# Patient Record
Sex: Female | Born: 1935 | Race: White | Hispanic: No | Smoking: Never smoker
Health system: Southern US, Community
[De-identification: ages and names within clinical notes are randomized; demographics above are authoritative.]

## PROBLEM LIST (undated history)

## (undated) DIAGNOSIS — I10 Essential (primary) hypertension: Secondary | ICD-10-CM

## (undated) HISTORY — PX: CATARACT EXTRACTION: SUR2

---

## 2004-11-15 ENCOUNTER — Ambulatory Visit: Payer: Self-pay | Admitting: Internal Medicine

## 2005-11-16 ENCOUNTER — Ambulatory Visit: Payer: Self-pay | Admitting: Internal Medicine

## 2006-02-05 ENCOUNTER — Ambulatory Visit: Payer: Self-pay

## 2006-06-06 ENCOUNTER — Ambulatory Visit: Payer: Self-pay | Admitting: Gastroenterology

## 2006-09-12 ENCOUNTER — Ambulatory Visit: Payer: Self-pay | Admitting: Internal Medicine

## 2006-11-06 ENCOUNTER — Ambulatory Visit: Payer: Self-pay | Admitting: Unknown Physician Specialty

## 2006-11-19 ENCOUNTER — Ambulatory Visit: Payer: Self-pay | Admitting: Internal Medicine

## 2007-10-01 ENCOUNTER — Encounter: Payer: Self-pay | Admitting: Otolaryngology

## 2007-12-02 ENCOUNTER — Ambulatory Visit: Payer: Self-pay | Admitting: Internal Medicine

## 2008-04-29 ENCOUNTER — Other Ambulatory Visit: Payer: Self-pay

## 2008-04-29 ENCOUNTER — Ambulatory Visit: Payer: Self-pay | Admitting: General Surgery

## 2008-05-06 ENCOUNTER — Ambulatory Visit: Payer: Self-pay | Admitting: General Surgery

## 2008-12-16 ENCOUNTER — Ambulatory Visit: Payer: Self-pay | Admitting: Internal Medicine

## 2009-04-08 ENCOUNTER — Ambulatory Visit: Payer: Self-pay | Admitting: Internal Medicine

## 2009-07-21 ENCOUNTER — Emergency Department: Payer: Self-pay | Admitting: Unknown Physician Specialty

## 2009-08-02 ENCOUNTER — Emergency Department: Payer: Self-pay | Admitting: Emergency Medicine

## 2010-02-08 ENCOUNTER — Ambulatory Visit: Payer: Self-pay | Admitting: Internal Medicine

## 2011-09-20 ENCOUNTER — Emergency Department: Payer: Self-pay | Admitting: *Deleted

## 2012-06-03 ENCOUNTER — Emergency Department: Payer: Self-pay | Admitting: Emergency Medicine

## 2015-06-08 ENCOUNTER — Emergency Department
Admission: EM | Admit: 2015-06-08 | Discharge: 2015-06-08 | Disposition: A | Discharge status: Home / Self Care | Payer: Medicare Other | Attending: Student | Admitting: Student

## 2015-06-08 ENCOUNTER — Encounter: Payer: Self-pay | Admitting: *Deleted

## 2015-06-08 ENCOUNTER — Emergency Department: Payer: Medicare Other

## 2015-06-08 DIAGNOSIS — S53025A Posterior dislocation of left radial head, initial encounter: Secondary | ICD-10-CM | POA: Insufficient documentation

## 2015-06-08 DIAGNOSIS — W19XXXA Unspecified fall, initial encounter: Secondary | ICD-10-CM

## 2015-06-08 DIAGNOSIS — Y9289 Other specified places as the place of occurrence of the external cause: Secondary | ICD-10-CM | POA: Insufficient documentation

## 2015-06-08 DIAGNOSIS — S53105A Unspecified dislocation of left ulnohumeral joint, initial encounter: Secondary | ICD-10-CM

## 2015-06-08 DIAGNOSIS — I10 Essential (primary) hypertension: Secondary | ICD-10-CM | POA: Insufficient documentation

## 2015-06-08 DIAGNOSIS — IMO0001 Reserved for inherently not codable concepts without codable children: Secondary | ICD-10-CM

## 2015-06-08 DIAGNOSIS — W1839XA Other fall on same level, initial encounter: Secondary | ICD-10-CM | POA: Diagnosis not present

## 2015-06-08 DIAGNOSIS — Y9389 Activity, other specified: Secondary | ICD-10-CM | POA: Diagnosis not present

## 2015-06-08 DIAGNOSIS — Y998 Other external cause status: Secondary | ICD-10-CM | POA: Diagnosis not present

## 2015-06-08 DIAGNOSIS — S59902A Unspecified injury of left elbow, initial encounter: Secondary | ICD-10-CM | POA: Diagnosis present

## 2015-06-08 DIAGNOSIS — E871 Hypo-osmolality and hyponatremia: Secondary | ICD-10-CM | POA: Diagnosis not present

## 2015-06-08 LAB — CBC WITH DIFFERENTIAL/PLATELET
BASOS ABS: 0 10*3/uL (ref 0–0.1)
Basophils Relative: 0 %
Eosinophils Absolute: 0 10*3/uL (ref 0–0.7)
Eosinophils Relative: 0 %
HEMATOCRIT: 39.7 % (ref 35.0–47.0)
HEMOGLOBIN: 13.1 g/dL (ref 12.0–16.0)
LYMPHS PCT: 11 %
Lymphs Abs: 1 10*3/uL (ref 1.0–3.6)
MCH: 29.5 pg (ref 26.0–34.0)
MCHC: 33 g/dL (ref 32.0–36.0)
MCV: 89.4 fL (ref 80.0–100.0)
Monocytes Absolute: 1 10*3/uL — ABNORMAL HIGH (ref 0.2–0.9)
Monocytes Relative: 11 %
NEUTROS ABS: 6.9 10*3/uL — AB (ref 1.4–6.5)
NEUTROS PCT: 78 %
PLATELETS: 230 10*3/uL (ref 150–440)
RBC: 4.44 MIL/uL (ref 3.80–5.20)
RDW: 14.1 % (ref 11.5–14.5)
WBC: 9 10*3/uL (ref 3.6–11.0)

## 2015-06-08 LAB — APTT: APTT: 24 s (ref 24–36)

## 2015-06-08 LAB — BASIC METABOLIC PANEL
ANION GAP: 6 (ref 5–15)
BUN: 13 mg/dL (ref 6–20)
CHLORIDE: 95 mmol/L — AB (ref 101–111)
CO2: 28 mmol/L (ref 22–32)
Calcium: 9.1 mg/dL (ref 8.9–10.3)
Creatinine, Ser: 0.63 mg/dL (ref 0.44–1.00)
GFR calc Af Amer: >60 mL/min (ref >60)
GLUCOSE: 100 mg/dL — AB (ref 65–99)
POTASSIUM: 4.4 mmol/L (ref 3.5–5.1)
Sodium: 129 mmol/L — ABNORMAL LOW (ref 135–145)

## 2015-06-08 LAB — PROTIME-INR
INR: 0.9
Prothrombin Time: 12.4 seconds (ref 11.4–15.0)

## 2015-06-08 MED ORDER — MORPHINE SULFATE (PF) 2 MG/ML IV SOLN
2.0000 mg | Freq: Once | INTRAVENOUS | Status: AC
  Administered 2015-06-08: 2 mg via INTRAVENOUS
  Filled 2015-06-08: qty 1

## 2015-06-08 MED ORDER — ONDANSETRON HCL 4 MG/2ML IJ SOLN
4.0000 mg | Freq: Once | INTRAMUSCULAR | Status: AC
  Administered 2015-06-08: 4 mg via INTRAVENOUS
  Filled 2015-06-08: qty 2

## 2015-06-08 MED ORDER — MIDAZOLAM HCL 5 MG/5ML IJ SOLN
INTRAMUSCULAR | Status: AC
  Administered 2015-06-08: 0.5 mg via INTRAVENOUS
  Filled 2015-06-08: qty 5

## 2015-06-08 MED ORDER — MIDAZOLAM HCL 5 MG/5ML IJ SOLN
0.5000 mg | Freq: Once | INTRAMUSCULAR | Status: AC
  Administered 2015-06-08: 0.5 mg via INTRAVENOUS

## 2015-06-08 MED ORDER — ACETAMINOPHEN 500 MG PO TABS
500.0000 mg | ORAL_TABLET | Freq: Four times a day (QID) | ORAL | Status: AC
Start: 2015-06-08 — End: 2016-06-07

## 2015-06-08 NOTE — ED Notes (Signed)
Pt and daughter given verbal and written discharge instructions. Verbalized understanding.

## 2015-06-08 NOTE — ED Provider Notes (Signed)
[facility] Emergency Department Provider Note  ____________________________________________  Time seen: Approximately 11:03 AM  I have reviewed the triage vital signs and the nursing notes.   HISTORY  Chief Complaint Fall    HPI Krista Weber is a 81 y.o. female with history of GERD and hypertension, not chronically anticoagulated who presents for evaluation of left elbow pain after fall which occurred suddenly just prior to arrival. The patient reports that she woke this morning in her usual state of health, was getting dressed to head out of the house. She was walking out of the house and she stumbled and fell backwards landing onto the left elbow, she did hit her head, she did not lose consciousness. She had no fainting, no presyncopal prodrome, no lightheadedness, dizziness, chest pain or difficulty breathing prior to fall. She complains of no neck pain. Currently the pain in her elbow is moderate and worse with movement. She denies any recent illness including no cough, sneezing, runny nose, congestion, vomiting, diarrhea, fevers or chills. She reports chronic intermittent lightheadedness for which she is followed by her primary care doctor, Dr. Judithann Sheen, and she reports that she has had issues with low sodium in the past.   History reviewed. No pertinent past medical history.  There are no active problems to display for this patient.   History reviewed. No pertinent past surgical history.  No current outpatient prescriptions on file.  Allergies Macrodantin  History reviewed. No pertinent family history.  Social History Social History  Substance Use Topics  . Smoking status: Never Smoker   . Smokeless tobacco: None  . Alcohol Use: None    Review of Systems Constitutional: No fever/chills Eyes: No visual changes. ENT: No sore throat. Cardiovascular: Denies chest pain. Respiratory: Denies shortness of breath. Gastrointestinal: No abdominal  pain.  No nausea, no vomiting.  No diarrhea.  No constipation. Genitourinary: Negative for dysuria. Musculoskeletal: Negative for back pain. Skin: Negative for rash. Neurological: Negative for headaches, focal weakness or numbness.  10-point ROS otherwise negative.  ____________________________________________   PHYSICAL EXAM:  VITAL SIGNS: ED Triage Vitals  Enc Vitals Group     BP 06/08/15 1048 201/84 mmHg     Pulse Rate 06/08/15 1048 67     Resp 06/08/15 1048 20     Temp 06/08/15 1048 98.9 F (37.2 C)     Temp Source 06/08/15 1048 Oral     SpO2 06/08/15 1048 97 %     Weight 06/08/15 1048 120 lb (54.432 kg)     Height 06/08/15 1048 5\' 4"  (1.626 m)     Head Cir --      Peak Flow --      Pain Score 06/08/15 1051 8     Pain Loc --      Pain Edu? --      Excl. in GC? --     Constitutional: Alert and oriented. Well appearing and in no acute distress. Eyes: Conjunctivae are normal. PERRL. EOMI. Head: Atraumatic. Nose: No congestion/rhinnorhea. Mouth/Throat: Mucous membranes are moist.  Oropharynx non-erythematous. Neck: No stridor.  No cervical spine tenderness to palpation. Cardiovascular: Normal rate, regular rhythm. Grossly normal heart sounds.  Good peripheral circulation. Respiratory: Normal respiratory effort.  No retractions. Lungs CTAB. Gastrointestinal: Soft and nontender. No distention. No abdominal bruits. No CVA tenderness. Genitourinary: deferred Musculoskeletal: No lower extremity tenderness nor edema.  No joint effusions. Tenderness to palpation throughout the left elbow with limited ROM. 2+ left radial pulse, radial/median/ulnar nerve intact in the  left upper extremity. Pelvis is stable to rock and compression. Full painless range of motion of bilateral hip joints. No midline tenderness to palpation throat the T or L-spine. Neurologic:  Normal speech and language. No gross focal neurologic deficits are appreciated. Skin:  Skin is warm, dry and intact. No rash  noted. Psychiatric: Mood and affect are normal. Speech and behavior are normal.  ____________________________________________   LABS (all labs ordered are listed, but only abnormal results are displayed)  Labs Reviewed  CBC WITH DIFFERENTIAL/PLATELET - Abnormal; Notable for the following:    Neutro Abs 6.9 (*)    Monocytes Absolute 1.0 (*)    All other components within normal limits  BASIC METABOLIC PANEL - Abnormal; Notable for the following:    Sodium 129 (*)    Chloride 95 (*)    Glucose, Bld 100 (*)    All other components within normal limits  PROTIME-INR  APTT   ____________________________________________  EKG  ED ECG REPORT I, Gayla Doss, the attending physician, personally viewed and interpreted this ECG.   Date: 06/08/2015  EKG Time: 10:49  Rate: 68  Rhythm: normal EKG, normal sinus rhythm  Axis: Normal  Intervals:none  ST&T Change: No acute ST elevation  ____________________________________________  RADIOLOGY  Left elbow xray IMPRESSION: Posterior dislocation of the left elbow with probable avulsion fractures of the olecranon and coronoid processes.  Attempted post-reduction left elbow xray IMPRESSION: Persistent elbow dislocation. Small fragment arising from the olecranon process in the joint space. There is now an avulsion fracture which probably arises from the coracoid process of the proximal ulna, located inferior to the anteriorly displaced distal humerus.  Post-reduction left elbow xray IMPRESSION: Previously noted dislocation has been reduced. Fracture fragments persists.   CT head IMPRESSION: No acute intracranial abnormality. ____________________________________________   PROCEDURES  Procedure(s) performed:  Reduction of dislocation Date/Time: 2:04 PM Performed by: Gayla Doss Authorized by: Toney Rakes A Consent: Verbal consent obtained. Risks and benefits: risks, benefits and alternatives were discussed Consent  given by: patient Required items: required blood products, implants, devices, and special equipment available Time out: Immediately prior to procedure a "time out" was called to verify the correct patient, procedure, equipment, support staff and site/side marked as required.  Patient sedated: 1 mg versed  Vitals: Vital signs were monitored during sedation. Patient tolerance: Patient tolerated the procedure well with no immediate complications. Joint: left elbow Reduction technique: traction   SPLINT APPLICATION Date/Time: 2:04 PM Authorized by: Toney Rakes A Consent: Verbal consent obtained. Risks and benefits: risks, benefits and alternatives were discussed Consent given by: patient Splint applied by: Dr. Inocencio Homes, nurse, tech Location details: left elbow Splint type: Posterior slab  Supplies used: orthoglass Post-procedure: The splinted body part was neurovascularly unchanged following the procedure. Patient tolerance: Patient tolerated the procedure well with no immediate complications.    Critical Care performed: No  ____________________________________________   INITIAL IMPRESSION / ASSESSMENT AND PLAN / ED COURSE  Pertinent labs & imaging results that were available during my care of the patient were reviewed by me and considered in my medical decision making (see chart for details).  Krista Weber is a 81 y.o. female with history of GERD and hypertension, not chronically anticoagulated who presents for evaluation of left elbow pain after fall which occurred suddenly just prior to arrival. On exam, she is very well-appearing and in no acute distress. Really hypertensive on arrival but I suspect this is secondary to pain, we'll reassess. Her exam is notable for  tenderness throughout the left elbow but exam is otherwise atraumatic. I suspect she had a mechanical fall today. She is endorsing some chronic lightheadedness which is unchanged from prior as well as history of   hyponatremia so will check basic labs, CT head and x-ray left elbow. We'll provide pain control.   ----------------------------------------- 1:57 PM on 06/08/2015 ----------------------------------------- Labs reviewed. CBC unremarkable. Normal coags. BMP shows hyponatremia with sodium of 129 however I discussed this with Dr. Marcello Fennel, on call for her primary care doctor, Dr. Judithann Sheen, who reports that her sodium was 130 when it was checked in the office 3 weeks ago. Hyponatremia is chronic and unchanged from baseline and I do not think that this contributed to her fall today. Dr. Marcello Fennel will speak with Dr. Tonie Griffith. Judithann Sheen nurse to arrange expedient follow-up. Left elbow films were initially concerning for elbow fracture/dislocation. I discussed this with Dr. Hyacinth Meeker of orthopedic surgery who recommended attempt at reduction and splinting in greater than 90 angle at the elbow. The patient received a total of 1 mg of Versed and after 2 attempts, the dislocation was successfully reduced. She remains neurovasculary intact in the left upper extremity after splinting. We discussed return precautions, need for close PCP and orthopedic surgery follow-up and she is comfortable with the discharge plan.  ____________________________________________   FINAL CLINICAL IMPRESSION(S) / ED DIAGNOSES  Final diagnoses:  Elbow dislocation, left, initial encounter  Fall, initial encounter  Hyponatremia      Gayla Doss, MD 06/08/15 1404

## 2015-06-08 NOTE — ED Notes (Signed)
Attempted to start IV on pts right wrist area, vein blew and immediate brusing and discolration to the skin occoured, MD notified, ice applied, ACE wrap applied

## 2015-06-08 NOTE — ED Notes (Signed)
Pt arrives via EMS from home, pt had a fall this AM, denies any LOC, states her feet got tangled up under her, pt states pain on her left elebow, states some dizziness and low NA for several weeks

## 2015-08-13 ENCOUNTER — Emergency Department
Admission: EM | Admit: 2015-08-13 | Discharge: 2015-08-13 | Disposition: A | Discharge status: Home / Self Care | Payer: Medicare Other | Attending: Emergency Medicine | Admitting: Emergency Medicine

## 2015-08-13 ENCOUNTER — Encounter: Payer: Self-pay | Admitting: Emergency Medicine

## 2015-08-13 ENCOUNTER — Emergency Department: Payer: Medicare Other

## 2015-08-13 DIAGNOSIS — S6991XA Unspecified injury of right wrist, hand and finger(s), initial encounter: Secondary | ICD-10-CM | POA: Diagnosis present

## 2015-08-13 DIAGNOSIS — S60219A Contusion of unspecified wrist, initial encounter: Secondary | ICD-10-CM

## 2015-08-13 DIAGNOSIS — S0081XA Abrasion of other part of head, initial encounter: Secondary | ICD-10-CM | POA: Insufficient documentation

## 2015-08-13 DIAGNOSIS — Z79899 Other long term (current) drug therapy: Secondary | ICD-10-CM | POA: Insufficient documentation

## 2015-08-13 DIAGNOSIS — W1839XA Other fall on same level, initial encounter: Secondary | ICD-10-CM | POA: Insufficient documentation

## 2015-08-13 DIAGNOSIS — S60211A Contusion of right wrist, initial encounter: Secondary | ICD-10-CM | POA: Insufficient documentation

## 2015-08-13 DIAGNOSIS — S60212A Contusion of left wrist, initial encounter: Secondary | ICD-10-CM | POA: Diagnosis not present

## 2015-08-13 DIAGNOSIS — Y9389 Activity, other specified: Secondary | ICD-10-CM | POA: Diagnosis not present

## 2015-08-13 DIAGNOSIS — I10 Essential (primary) hypertension: Secondary | ICD-10-CM | POA: Insufficient documentation

## 2015-08-13 DIAGNOSIS — Y998 Other external cause status: Secondary | ICD-10-CM | POA: Insufficient documentation

## 2015-08-13 DIAGNOSIS — W19XXXA Unspecified fall, initial encounter: Secondary | ICD-10-CM

## 2015-08-13 DIAGNOSIS — Y9289 Other specified places as the place of occurrence of the external cause: Secondary | ICD-10-CM | POA: Insufficient documentation

## 2015-08-13 HISTORY — DX: Essential (primary) hypertension: I10

## 2015-08-13 NOTE — ED Notes (Signed)
Pt here after losing her footing, fell and landed on the left side of her face, lac to left temple noted, bleeding controlled, denies use of blood thinners. Also c/o right elbow pain. Denies LOC. Denies pain.

## 2015-08-13 NOTE — ED Provider Notes (Signed)
[facility] Emergency Department Provider Note  ____________________________________________  Time seen: Approximately 5:53 PM  I have reviewed the triage vital signs and the nursing notes.   HISTORY  Chief Complaint Fall; Laceration; and Extremity Laceration    HPI Krista Weber is a 81 y.o. female who presents to the emergency department status post a fall. Per the patient's daughter who accompanied patient the patient had a mechanical fall when she was turning around reaching for her walker. Patient fell forward catching most of her fall with her arms. The patient does have a report of a abrasion to left temple region with minimal bleeding. Per the patient she is not on blood thinners. Is denying any pain or symptoms at this time. She denies headache, visual acuity changes, neck pain, chest pain, shortness of breath, nausea or vomiting, numbness or tingling. Per the daughter the patient has been acting normal since time of incident.   Past Medical History  Diagnosis Date  . Hypertension     There are no active problems to display for this patient.   History reviewed. No pertinent past surgical history.  Current Outpatient Rx  Name  Route  Sig  Dispense  Refill  . acetaminophen (TYLENOL) 500 MG tablet   Oral   Take 1 tablet (500 mg total) by mouth every 6 (six) hours.   30 tablet   0     Allergies Macrodantin  No family history on file.  Social History Social History  Substance Use Topics  . Smoking status: Never Smoker   . Smokeless tobacco: None  . Alcohol Use: No    Review of Systems Constitutional: No fever/chills Eyes: No visual changes. ENT: No sore throat. Cardiovascular: Denies chest pain. Respiratory: Denies shortness of breath. Gastrointestinal: No abdominal pain.  No nausea, no vomiting.  No diarrhea.  No constipation. Genitourinary: Negative for dysuria. Musculoskeletal: Negative for back pain. Skin: Negative for  rash. Endorses abrasion/cut to left temple area. Neurological: Negative for headaches, focal weakness or numbness.  10-point ROS otherwise negative.  ____________________________________________   PHYSICAL EXAM:  VITAL SIGNS: ED Triage Vitals  Enc Vitals Group     BP 08/13/15 1617 148/76 mmHg     Pulse Rate 08/13/15 1617 79     Resp 08/13/15 1617 18     Temp 08/13/15 1617 97.7 F (36.5 C)     Temp Source 08/13/15 1617 Oral     SpO2 08/13/15 1617 95 %     Weight 08/13/15 1617 120 lb (54.432 kg)     Height 08/13/15 1617 5\' 5"  (1.651 m)     Head Cir --      Peak Flow --      Pain Score --      Pain Loc --      Pain Edu? --      Excl. in GC? --     Constitutional: Alert and oriented. Well appearing and in no acute distress. Eyes: Conjunctivae are normal. PERRL. EOMI. Head: Atraumatic. Nose: No congestion/rhinnorhea. Mouth/Throat: Mucous membranes are moist.  Oropharynx non-erythematous. Neck: No stridor.  No cervical spine tenderness to palpation. Cardiovascular: Normal rate, regular rhythm. Grossly normal heart sounds.  Good peripheral circulation. Respiratory: Normal respiratory effort.  No retractions. Lungs CTAB. Gastrointestinal: Soft and nontender. No distention. No abdominal bruits. No CVA tenderness. Musculoskeletal: No lower extremity tenderness nor edema.  No joint effusions. No tenderness to palpation over upper extremities. No visible deformities to upper extremities. Mild ecchymosis noting to bilateral wrists.  Patient denies tenderness to palpation over distal ulna or radius bilaterally. Neurologic:  Normal speech and language. No gross focal neurologic deficits are appreciated. No gait instability. Cranial nerves II through XII grossly intact. Skin:  Skin is warm, dry and intact. No rash noted. Mild abrasion noted to left temporal region measuring 0.5 cm in length. Abrasion is very superficial in nature. Bleeding is controlled. No foreign body or contamination. No  underlying hematoma. Psychiatric: Mood and affect are normal. Speech and behavior are normal.  ____________________________________________   LABS (all labs ordered are listed, but only abnormal results are displayed)  Labs Reviewed - No data to display ____________________________________________  EKG   ____________________________________________  RADIOLOGY  CT head without Impression: No acute intracranial pathology. Chronic microvascular disease and cerebral atrophy. ____________________________________________   PROCEDURES  Procedure(s) performed: None  Critical Care performed: No  ____________________________________________   INITIAL IMPRESSION / ASSESSMENT AND PLAN / ED COURSE  Pertinent labs & imaging results that were available during my care of the patient were reviewed by me and considered in my medical decision making (see chart for details).  Patient's history, symptoms, physical exam, radiological findings are taken into consideration for diagnosis. Patient has an abrasion to the left temporal area that does not require suturing or other care. Area is cleansed with no visible foreign body. CT head reveals no fractures or intracranial pathology. Patient's neuro status is intact. Patient does endorse some mild pain to upper extremities from where she broke her fall. There is no tenderness to palpation, palpable abnormality or deformity. Mild ecchymosis noted to area. No radiological studies are undertaken at this time. He has strict ED precautions to return to the patient and her daughter should symptoms worsen to include any neuro deficits, increasing pain, or bleeding from the abrasion site. They verbalized understanding of this. Patient will be discharged home in care of her daughter. ____________________________________________   FINAL CLINICAL IMPRESSION(S) / ED DIAGNOSES  Final diagnoses:  Fall, initial encounter  Forehead abrasion, initial encounter   Superficial bruising of wrist, unspecified laterality, initial encounter      Racheal Patches, PA-C 08/13/15 1810  Phineas Semen, MD 08/13/15 1928

## 2015-08-13 NOTE — ED Notes (Signed)
CT scan negative, pt to flex wait.

## 2015-08-13 NOTE — Discharge Instructions (Signed)
Abrasion An abrasion is a cut or scrape on the outer surface of your skin. An abrasion does not extend through all of the layers of your skin. It is important to care for your abrasion properly to prevent infection. CAUSES Most abrasions are caused by falling on or gliding across the ground or another surface. When your skin rubs on something, the outer and inner layer of skin rubs off.  SYMPTOMS A cut or scrape is the main symptom of this condition. The scrape may be bleeding, or it may appear red or pink. If there was an associated fall, there may be an underlying bruise. DIAGNOSIS An abrasion is diagnosed with a physical exam. TREATMENT Treatment for this condition depends on how large and deep the abrasion is. Usually, your abrasion will be cleaned with water and mild soap. This removes any dirt or debris that may be stuck. An antibiotic ointment may be applied to the abrasion to help prevent infection. A bandage (dressing) may be placed on the abrasion to keep it clean. You may also need a tetanus shot. HOME CARE INSTRUCTIONS Medicines  Take or apply medicines only as directed by your health care provider.  If you were prescribed an antibiotic ointment, finish all of it even if you start to feel better. Wound Care  Clean the wound with mild soap and water 2-3 times per day or as directed by your health care provider. Pat your wound dry with a clean towel. Do not rub it.  There are many different ways to close and cover a wound. Follow instructions from your health care provider about:  Wound care.  Dressing changes and removal.  Check your wound every day for signs of infection. Watch for:  Redness, swelling, or pain.  Fluid, blood, or pus. General Instructions  Keep the dressing dry as directed by your health care provider. Do not take baths, swim, use a hot tub, or do anything that would put your wound underwater until your health care provider approves.  If there is  swelling, raise (elevate) the injured area above the level of your heart while you are sitting or lying down.  Keep all follow-up visits as directed by your health care provider. This is important. SEEK MEDICAL CARE IF:  You received a tetanus shot and you have swelling, severe pain, redness, or bleeding at the injection site.  Your pain is not controlled with medicine.  You have increased redness, swelling, or pain at the site of your wound. SEEK IMMEDIATE MEDICAL CARE IF:  You have a red streak going away from your wound.  You have a fever.  You have fluid, blood, or pus coming from your wound.  You notice a bad smell coming from your wound or your dressing.   This information is not intended to replace advice given to you by your health care provider. Make sure you discuss any questions you have with your health care provider.   Document Released: 06/20/2005 Document Revised: 06/01/2015 Document Reviewed: 09/08/2014 Elsevier Interactive Patient Education 2016 [employer]  

## 2015-08-13 NOTE — ED Notes (Signed)
NAd noted at this time. Pt taken to lobby via wheelchair. Pt denies comments/questions at this time.

## 2015-08-13 NOTE — ED Notes (Signed)
Bruising noted to right hand. Pt denies pain.

## 2015-09-19 ENCOUNTER — Emergency Department: Payer: Medicare Other

## 2015-09-19 ENCOUNTER — Observation Stay
Admit: 2015-09-19 | Discharge: 2015-09-19 | Disposition: A | Discharge status: Home / Self Care | Payer: Medicare Other | Attending: Internal Medicine | Admitting: Internal Medicine

## 2015-09-19 ENCOUNTER — Encounter: Payer: Self-pay | Admitting: Emergency Medicine

## 2015-09-19 ENCOUNTER — Observation Stay
Admission: EM | Admit: 2015-09-19 | Discharge: 2015-09-20 | Disposition: A | Discharge status: Home / Self Care | Payer: Medicare Other | Attending: Internal Medicine | Admitting: Internal Medicine

## 2015-09-19 ENCOUNTER — Observation Stay: Payer: Medicare Other

## 2015-09-19 DIAGNOSIS — Z79899 Other long term (current) drug therapy: Secondary | ICD-10-CM | POA: Insufficient documentation

## 2015-09-19 DIAGNOSIS — R413 Other amnesia: Secondary | ICD-10-CM | POA: Diagnosis not present

## 2015-09-19 DIAGNOSIS — I351 Nonrheumatic aortic (valve) insufficiency: Secondary | ICD-10-CM | POA: Insufficient documentation

## 2015-09-19 DIAGNOSIS — M50321 Other cervical disc degeneration at C4-C5 level: Secondary | ICD-10-CM | POA: Diagnosis not present

## 2015-09-19 DIAGNOSIS — Z881 Allergy status to other antibiotic agents status: Secondary | ICD-10-CM | POA: Insufficient documentation

## 2015-09-19 DIAGNOSIS — I739 Peripheral vascular disease, unspecified: Secondary | ICD-10-CM | POA: Diagnosis not present

## 2015-09-19 DIAGNOSIS — K219 Gastro-esophageal reflux disease without esophagitis: Secondary | ICD-10-CM | POA: Insufficient documentation

## 2015-09-19 DIAGNOSIS — R200 Anesthesia of skin: Secondary | ICD-10-CM | POA: Diagnosis not present

## 2015-09-19 DIAGNOSIS — R4701 Aphasia: Secondary | ICD-10-CM | POA: Diagnosis not present

## 2015-09-19 DIAGNOSIS — R32 Unspecified urinary incontinence: Secondary | ICD-10-CM | POA: Diagnosis not present

## 2015-09-19 DIAGNOSIS — I6521 Occlusion and stenosis of right carotid artery: Secondary | ICD-10-CM | POA: Diagnosis not present

## 2015-09-19 DIAGNOSIS — I6522 Occlusion and stenosis of left carotid artery: Secondary | ICD-10-CM | POA: Insufficient documentation

## 2015-09-19 DIAGNOSIS — R4781 Slurred speech: Secondary | ICD-10-CM | POA: Insufficient documentation

## 2015-09-19 DIAGNOSIS — Z7982 Long term (current) use of aspirin: Secondary | ICD-10-CM | POA: Diagnosis not present

## 2015-09-19 DIAGNOSIS — I10 Essential (primary) hypertension: Secondary | ICD-10-CM | POA: Insufficient documentation

## 2015-09-19 DIAGNOSIS — Z9842 Cataract extraction status, left eye: Secondary | ICD-10-CM | POA: Diagnosis not present

## 2015-09-19 DIAGNOSIS — I071 Rheumatic tricuspid insufficiency: Secondary | ICD-10-CM | POA: Insufficient documentation

## 2015-09-19 DIAGNOSIS — Z9841 Cataract extraction status, right eye: Secondary | ICD-10-CM | POA: Diagnosis not present

## 2015-09-19 DIAGNOSIS — I340 Nonrheumatic mitral (valve) insufficiency: Secondary | ICD-10-CM | POA: Insufficient documentation

## 2015-09-19 DIAGNOSIS — G459 Transient cerebral ischemic attack, unspecified: Principal | ICD-10-CM | POA: Diagnosis present

## 2015-09-19 DIAGNOSIS — M069 Rheumatoid arthritis, unspecified: Secondary | ICD-10-CM | POA: Diagnosis not present

## 2015-09-19 LAB — CBC WITH DIFFERENTIAL/PLATELET
Basophils Absolute: 0 10*3/uL (ref 0–0.1)
Basophils Relative: 0 %
EOS ABS: 0 10*3/uL (ref 0–0.7)
EOS PCT: 0 %
HCT: 39 % (ref 35.0–47.0)
Hemoglobin: 13.1 g/dL (ref 12.0–16.0)
LYMPHS ABS: 1.1 10*3/uL (ref 1.0–3.6)
Lymphocytes Relative: 13 %
MCH: 29.7 pg (ref 26.0–34.0)
MCHC: 33.7 g/dL (ref 32.0–36.0)
MCV: 88.3 fL (ref 80.0–100.0)
MONO ABS: 1.1 10*3/uL — AB (ref 0.2–0.9)
MONOS PCT: 14 %
Neutro Abs: 6.2 10*3/uL (ref 1.4–6.5)
Neutrophils Relative %: 73 %
PLATELETS: 234 10*3/uL (ref 150–440)
RBC: 4.42 MIL/uL (ref 3.80–5.20)
RDW: 13.9 % (ref 11.5–14.5)
WBC: 8.4 10*3/uL (ref 3.6–11.0)

## 2015-09-19 LAB — URINALYSIS COMPLETE WITH MICROSCOPIC (ARMC ONLY)
Bilirubin Urine: NEGATIVE
GLUCOSE, UA: NEGATIVE mg/dL
HGB URINE DIPSTICK: NEGATIVE
Ketones, ur: NEGATIVE mg/dL
LEUKOCYTES UA: NEGATIVE
NITRITE: NEGATIVE
PH: 7 (ref 5.0–8.0)
Protein, ur: NEGATIVE mg/dL
SPECIFIC GRAVITY, URINE: 1.005 (ref 1.005–1.030)

## 2015-09-19 LAB — LIPID PANEL
Cholesterol: 174 mg/dL (ref 0–200)
HDL: 46 mg/dL (ref >40)
LDL Cholesterol: 106 mg/dL — ABNORMAL HIGH (ref 0–99)
Total CHOL/HDL Ratio: 3.8 RATIO
Triglycerides: 109 mg/dL (ref <150)
VLDL: 22 mg/dL (ref 0–40)

## 2015-09-19 LAB — TROPONIN I: Troponin I: <0.03 ng/mL (ref <0.031)

## 2015-09-19 LAB — COMPREHENSIVE METABOLIC PANEL
ALT: 15 U/L (ref 14–54)
ANION GAP: 6 (ref 5–15)
AST: 18 U/L (ref 15–41)
Albumin: 3.6 g/dL (ref 3.5–5.0)
Alkaline Phosphatase: 102 U/L (ref 38–126)
BUN: 13 mg/dL (ref 6–20)
CHLORIDE: 94 mmol/L — AB (ref 101–111)
CO2: 27 mmol/L (ref 22–32)
Calcium: 9.1 mg/dL (ref 8.9–10.3)
Creatinine, Ser: 0.57 mg/dL (ref 0.44–1.00)
GFR calc Af Amer: >60 mL/min (ref >60)
GFR calc non Af Amer: >60 mL/min (ref >60)
GLUCOSE: 106 mg/dL — AB (ref 65–99)
POTASSIUM: 4.4 mmol/L (ref 3.5–5.1)
SODIUM: 127 mmol/L — AB (ref 135–145)
TOTAL PROTEIN: 6.1 g/dL — AB (ref 6.5–8.1)
Total Bilirubin: 0.8 mg/dL (ref 0.3–1.2)

## 2015-09-19 LAB — GLUCOSE, CAPILLARY: GLUCOSE-CAPILLARY: 103 mg/dL — AB (ref 65–99)

## 2015-09-19 MED ORDER — HEPARIN SODIUM (PORCINE) 5000 UNIT/ML IJ SOLN
5000.0000 [IU] | Freq: Three times a day (TID) | INTRAMUSCULAR | Status: DC
Start: 2015-09-19 — End: 2015-09-20
  Administered 2015-09-19 – 2015-09-20 (×3): 5000 [IU] via SUBCUTANEOUS
  Filled 2015-09-19 (×3): qty 1

## 2015-09-19 MED ORDER — ASPIRIN EC 81 MG PO TBEC
81.0000 mg | DELAYED_RELEASE_TABLET | Freq: Every day | ORAL | Status: DC
Start: 2015-09-19 — End: 2015-09-20
  Administered 2015-09-19 – 2015-09-20 (×2): 81 mg via ORAL
  Filled 2015-09-19 (×2): qty 1

## 2015-09-19 MED ORDER — OXYBUTYNIN CHLORIDE ER 5 MG PO TB24
5.0000 mg | ORAL_TABLET | Freq: Every day | ORAL | Status: DC
  Administered 2015-09-19 – 2015-09-20 (×2): 5 mg via ORAL
  Filled 2015-09-19 (×2): qty 1

## 2015-09-19 MED ORDER — FLUTICASONE PROPIONATE 50 MCG/ACT NA SUSP
2.0000 | Freq: Every day | NASAL | Status: DC
  Administered 2015-09-20: 08:00:00 2 via NASAL
  Filled 2015-09-19: qty 16

## 2015-09-19 MED ORDER — IMIPRAMINE HCL 25 MG PO TABS
25.0000 mg | ORAL_TABLET | Freq: Every day | ORAL | Status: DC
  Administered 2015-09-19: 25 mg via ORAL
  Filled 2015-09-19 (×3): qty 1

## 2015-09-19 MED ORDER — SODIUM CHLORIDE 0.9 % IV SOLN
INTRAVENOUS | Status: DC
  Administered 2015-09-19 – 2015-09-20 (×2): via INTRAVENOUS

## 2015-09-19 MED ORDER — PROPRANOLOL HCL 40 MG PO TABS
40.0000 mg | ORAL_TABLET | Freq: Two times a day (BID) | ORAL | Status: DC
  Administered 2015-09-19 – 2015-09-20 (×2): 40 mg via ORAL
  Filled 2015-09-19 (×5): qty 1

## 2015-09-19 MED ORDER — ALPRAZOLAM 0.25 MG PO TABS: 0.2500 mg | ORAL_TABLET | Freq: Three times a day (TID) | ORAL | Status: DC | PRN

## 2015-09-19 MED ORDER — CLONIDINE HCL 0.1 MG PO TABS
0.1000 mg | ORAL_TABLET | Freq: Every day | ORAL | Status: DC
Start: 2015-09-19 — End: 2015-09-20
  Administered 2015-09-19: 0.1 mg via ORAL
  Filled 2015-09-19: qty 1

## 2015-09-19 MED ORDER — STROKE: EARLY STAGES OF RECOVERY BOOK
Freq: Once | Status: AC
  Administered 2015-09-19: 17:00:00

## 2015-09-19 NOTE — H&P (Signed)
Krista Weber is an 81 y.o. female.   Chief Complaint: Numbness HPI: The patient presents to the emergency department after an episode of slurred speech and numbness on her left side this morning. Symptoms reportedly resolved after possibly 5 minutes. She had no symptoms upon arrival to the emergency department. The patient denies pain anywhere. She also denies focal team swallowing, speaking or weakness in her extremities. Head CT showed no acute infarct. Despite reassurance the patient's family insisted upon admission which prompted the emergency department staff to ask the hospitalist service for inpatient workup.  Past Medical History  Diagnosis Date  . Hypertension     Past Surgical History  Procedure Laterality Date  . Cataract extraction      Family History  Problem Relation Age of Onset  . Rheum arthritis Father    Social History:  reports that she has never smoked. She does not have any smokeless tobacco history on file. She reports that she does not drink alcohol. Her drug history is not on file.  Allergies:  Allergies  Allergen Reactions  . Macrodantin [Nitrofurantoin Macrocrystal]     Prior to Admission medications   Medication Sig Start Date End Date Taking? Authorizing Provider  acetaminophen (TYLENOL) 500 MG tablet Take 1 tablet (500 mg total) by mouth every 6 (six) hours. 06/08/15 06/07/16 Yes Joanne Gavel, MD  ALPRAZolam Duanne Moron) 0.25 MG tablet Take 0.25 mg by mouth 3 (three) times daily as needed for anxiety.   Yes Historical Provider, MD  aspirin EC 81 MG tablet Take 1 tablet by mouth daily.   Yes Historical Provider, MD  cloNIDine (CATAPRES) 0.1 MG tablet Take 0.1 mg by mouth daily.   Yes Historical Provider, MD  fluticasone (FLONASE) 50 MCG/ACT nasal spray Place 2 sprays into both nostrils daily.   Yes Historical Provider, MD  imipramine (TOFRANIL) 25 MG tablet Take 25 mg by mouth at bedtime.   Yes Historical Provider, MD  omeprazole (PRILOSEC) 20 MG capsule  Take 40 mg by mouth daily.   Yes Historical Provider, MD  oxybutynin (DITROPAN-XL) 5 MG 24 hr tablet Take 5 mg by mouth daily.   Yes Historical Provider, MD  propranolol (INDERAL) 40 MG tablet Take 40 mg by mouth 2 (two) times daily.   Yes Historical Provider, MD     Results for orders placed or performed during the hospital encounter of 09/19/15 (from the past 48 hour(s))  CBC with Differential     Status: Abnormal   Collection Time: 09/19/15 11:13 AM  Result Value Ref Range   WBC 8.4 3.6 - 11.0 K/uL   RBC 4.42 3.80 - 5.20 MIL/uL   Hemoglobin 13.1 12.0 - 16.0 g/dL   HCT 39.0 35.0 - 47.0 %   MCV 88.3 80.0 - 100.0 fL   MCH 29.7 26.0 - 34.0 pg   MCHC 33.7 32.0 - 36.0 g/dL   RDW 13.9 11.5 - 14.5 %   Platelets 234 150 - 440 K/uL   Neutrophils Relative % 73 %   Neutro Abs 6.2 1.4 - 6.5 K/uL   Lymphocytes Relative 13 %   Lymphs Abs 1.1 1.0 - 3.6 K/uL   Monocytes Relative 14 %   Monocytes Absolute 1.1 (H) 0.2 - 0.9 K/uL   Eosinophils Relative 0 %   Eosinophils Absolute 0.0 0 - 0.7 K/uL   Basophils Relative 0 %   Basophils Absolute 0.0 0 - 0.1 K/uL  Comprehensive metabolic panel     Status: Abnormal   Collection Time: 09/19/15  11:13 AM  Result Value Ref Range   Sodium 127 (L) 135 - 145 mmol/L   Potassium 4.4 3.5 - 5.1 mmol/L   Chloride 94 (L) 101 - 111 mmol/L   CO2 27 22 - 32 mmol/L   Glucose, Bld 106 (H) 65 - 99 mg/dL   BUN 13 6 - 20 mg/dL   Creatinine, Ser 0.57 0.44 - 1.00 mg/dL   Calcium 9.1 8.9 - 10.3 mg/dL   Total Protein 6.1 (L) 6.5 - 8.1 g/dL   Albumin 3.6 3.5 - 5.0 g/dL   AST 18 15 - 41 U/L   ALT 15 14 - 54 U/L   Alkaline Phosphatase 102 38 - 126 U/L   Total Bilirubin 0.8 0.3 - 1.2 mg/dL   GFR calc non Af Amer >60 >60 mL/min   GFR calc Af Amer >60 >60 mL/min    Comment: (NOTE) The eGFR has been calculated using the CKD EPI equation. This calculation has not been validated in all clinical situations. eGFR's persistently <60 mL/min signify possible Chronic  Kidney Disease.    Anion gap 6 5 - 15  Troponin I     Status: None   Collection Time: 09/19/15 11:13 AM  Result Value Ref Range   Troponin I <0.03 <0.031 ng/mL    Comment:        NO INDICATION OF MYOCARDIAL INJURY.   Glucose, capillary     Status: Abnormal   Collection Time: 09/19/15 11:14 AM  Result Value Ref Range   Glucose-Capillary 103 (H) 65 - 99 mg/dL  Urinalysis complete, with microscopic     Status: Abnormal   Collection Time: 09/19/15  1:06 PM  Result Value Ref Range   Color, Urine STRAW (A) YELLOW   APPearance CLEAR (A) CLEAR   Glucose, UA NEGATIVE NEGATIVE mg/dL   Bilirubin Urine NEGATIVE NEGATIVE   Ketones, ur NEGATIVE NEGATIVE mg/dL   Specific Gravity, Urine 1.005 1.005 - 1.030   Hgb urine dipstick NEGATIVE NEGATIVE   pH 7.0 5.0 - 8.0   Protein, ur NEGATIVE NEGATIVE mg/dL   Nitrite NEGATIVE NEGATIVE   Leukocytes, UA NEGATIVE NEGATIVE   RBC / HPF 0-5 0 - 5 RBC/hpf   WBC, UA 0-5 0 - 5 WBC/hpf   Bacteria, UA RARE (A) NONE SEEN   Squamous Epithelial / LPF 0-5 (A) NONE SEEN   Mucous PRESENT    Ct Head Wo Contrast  09/19/2015  CLINICAL DATA:  Numbness in both hands which began this morning. The patient subsequently had an episode of difficulty speaking which lasted 5-7 minutes. Initial encounter. EXAM: CT HEAD WITHOUT CONTRAST TECHNIQUE: Contiguous axial images were obtained from the base of the skull through the vertex without intravenous contrast. COMPARISON:  Head CT scan 06/08/2015 and 06/03/2012. FINDINGS: There is cortical atrophy and chronic microvascular ischemic change. No evidence of acute intracranial abnormality including hemorrhage, infarct, mass lesion, mass effect, midline shift or abnormal extra-axial fluid collection is identified. No hydrocephalus or pneumocephalus. The calvarium is intact. IMPRESSION: No acute abnormality. Atrophy and chronic microvascular ischemic change. Electronically Signed   By: Inge Rise M.D.   On: 09/19/2015 11:42     Review of Systems  Constitutional: Negative for fever and chills.  HENT: Negative for sore throat and tinnitus.   Eyes: Negative for blurred vision and redness.  Respiratory: Negative for cough and shortness of breath.   Cardiovascular: Negative for chest pain, palpitations, orthopnea and PND.  Gastrointestinal: Negative for nausea, vomiting, abdominal pain and diarrhea.  Genitourinary: Negative  for dysuria, urgency and frequency.  Musculoskeletal: Negative for myalgias and joint pain.  Skin: Negative for rash.       No lesions  Neurological: Negative for speech change, focal weakness and weakness.  Endo/Heme/Allergies: Does not bruise/bleed easily.       No temperature intolerance  Psychiatric/Behavioral: Negative for depression and suicidal ideas.    Blood pressure 176/78, pulse 69, resp. rate 16, height _0  (1.626 m), weight 55.339 kg (122 lb), SpO2 97 %. Physical Exam  Nursing note and vitals reviewed. Constitutional: She is oriented to person, place, and time. She appears well-developed and well-nourished.  HENT:  Head: Normocephalic and atraumatic.  Mouth/Throat: Oropharynx is clear and moist.  Eyes: Conjunctivae and EOM are normal. Pupils are equal, round, and reactive to light. No scleral icterus.  Neck: Normal range of motion. Neck supple. No JVD present. No tracheal deviation present. No thyromegaly present.  Cardiovascular: Normal rate, regular rhythm and normal heart sounds.  Exam reveals no gallop and no friction rub.   No murmur heard. Respiratory: Effort normal and breath sounds normal.  GI: Soft. Bowel sounds are normal. She exhibits no distension. There is no tenderness.  Genitourinary:  Deferred  Musculoskeletal: Normal range of motion. She exhibits no edema.  Lymphadenopathy:    She has no cervical adenopathy.  Neurological: She is alert and oriented to person, place, and time. No cranial nerve deficit. She exhibits normal muscle tone.  Skin: Skin is  warm and dry. No rash noted. No erythema.  Psychiatric: She has a normal mood and affect. Her behavior is normal. Judgment and thought content normal.     Assessment/Plan This is a 81 year old Caucasian female admitted for transient ischemic attack. 1. TIA: Patient currently has no neurologic deficits. They resolved prior to arrival. Per request of the family we will admit for observation and obtain MRI/MRA of the brain and extracranial vessels. Echocardiogram ordered. Neurology consult placed. 2. Hypertension: Continue clonidine and propranolol 3. Urinary incontinence: Continue imipramine and oxybutynin.  4. GI prophylaxis: None 5. DVT prophylaxis: Heparin The patient is a full code. Time spent on admission orders and patient care approximately 45 minutes  Harrie Foreman 09/19/2015, 2:03 PM

## 2015-09-19 NOTE — ED Notes (Signed)
Report to jerrie rn in ED until pt arrives to floor

## 2015-09-19 NOTE — Care Management Obs Status (Signed)
MEDICARE OBSERVATION STATUS NOTIFICATION   Patient Details  Name: Krista Weber MRN: [id number] Date of Birth: 10/20/1917   Medicare Observation Status Notification Given:  Yes Medicare notice given to patient and daughter in law in ER. Daughter signed for patient.    Berna Bue, RN 09/19/2015, 2:35 PM

## 2015-09-19 NOTE — ED Provider Notes (Signed)
[facility] Emergency Department Provider Note     Time seen: ----------------------------------------- 11:12 AM on 09/19/2015 -----------------------------------------    I have reviewed the triage vital signs and the nursing notes.   HISTORY  Chief Complaint Numbness and Aphasia    HPI Krista Weber is a 81 y.o. female who presents ER after about a 5 minute period of garbled speech. According to family she woke up with numbness in her hands, all the symptoms are temporary and have resolved at this time. Patient denies any complaints currently, only takes a baby aspirin and blood pressure medication.   Past Medical History  Diagnosis Date  . Hypertension     There are no active problems to display for this patient.   No past surgical history on file.  Allergies Macrodantin  Social History Social History  Substance Use Topics  . Smoking status: Never Smoker   . Smokeless tobacco: Not on file  . Alcohol Use: No    Review of Systems Constitutional: Negative for fever. Eyes: Negative for visual changes. ENT: Negative for sore throat. Cardiovascular: Negative for chest pain. Respiratory: Negative for shortness of breath. Gastrointestinal: Negative for abdominal pain, vomiting and diarrhea. Genitourinary: Negative for dysuria. Musculoskeletal: Negative for back pain. Skin: Negative for rash. Neurological: Positive for speech disturbance and paresthesias  10-point ROS otherwise negative.  ____________________________________________   PHYSICAL EXAM:  VITAL SIGNS: ED Triage Vitals  Enc Vitals Group     BP --      Pulse --      Resp --      Temp --      Temp src --      SpO2 --      Weight --      Height --      Head Cir --      Peak Flow --      Pain Score --      Pain Loc --      Pain Edu? --      Excl. in GC? --     Constitutional: Alert and oriented. Well appearing and in no distress. Eyes: Conjunctivae are  normal. PERRL. Normal extraocular movements. ENT   Head: Normocephalic and atraumatic.   Nose: No congestion/rhinnorhea.   Mouth/Throat: Mucous membranes are moist.   Neck: No stridor. Cardiovascular: Normal rate, regular rhythm. Normal and symmetric distal pulses are present in all extremities. No murmurs, rubs, or gallops. Respiratory: Normal respiratory effort without tachypnea nor retractions. Breath sounds are clear and equal bilaterally. No wheezes/rales/rhonchi. Gastrointestinal: Soft and nontender. No distention. No abdominal bruits.  Musculoskeletal: Nontender with normal range of motion in all extremities. No joint effusions.  No lower extremity tenderness nor edema. Neurologic:  Normal speech and language. No gross focal neurologic deficits are appreciated. Speech is normal. Negative Romberg, minimal gait instability, no gross sensory or motor deficits are appreciated this time. Skin:  Skin is warm, dry and intact. No rash noted. Psychiatric: Mood and affect are normal. Speech and behavior are normal. Patient exhibits appropriate insight and judgment. ____________________________________________  EKG: Interpreted by me. Normal sinus rhythm with a rate of 63 bpm, normal PR interval, normal curious with, normal QT interval.  ____________________________________________  ED COURSE:  Pertinent labs & imaging results that were available during my care of the patient were reviewed by me and considered in my medical decision making (see chart for details). She is in no acute distress, likely TIA. ____________________________________________    LABS (pertinent positives/negatives)  Labs Reviewed  CBC WITH DIFFERENTIAL/PLATELET - Abnormal; Notable for the following:    Monocytes Absolute 1.1 (*)    All other components within normal limits  COMPREHENSIVE METABOLIC PANEL - Abnormal; Notable for the following:    Sodium 127 (*)    Chloride 94 (*)    Glucose, Bld 106 (*)     Total Protein 6.1 (*)    All other components within normal limits  GLUCOSE, CAPILLARY - Abnormal; Notable for the following:    Glucose-Capillary 103 (*)    All other components within normal limits  TROPONIN I  URINALYSIS COMPLETEWITH MICROSCOPIC ([facility] ONLY)    RADIOLOGY  CT head is unremarkable  ____________________________________________  FINAL ASSESSMENT AND PLAN  Transient ischemic attack  Plan: Patient with labs and imaging as dictated above. Patient with a TIA, CT is negative and she is currently at her neurologic baseline. We'll advise admission and further workup.   Emily Filbert, MD   Emily Filbert, MD 09/19/15 [phone]

## 2015-09-19 NOTE — ED Notes (Signed)
Pt here with c/o numbness in bilateral hands this am after waking up, has had this occur in the past. Around 0950am, pt was unable to speak clearly for 5-7 min, family was unable to understand what she was saying at all. Upon initial assessment to ER triage, no stroke symptoms noted. Speech clear, no numbness per pt.

## 2015-09-19 NOTE — Plan of Care (Signed)
Pt admitted today for TIA/Stroke wkup after pt experienced garbled speech and tingling/numbness in her hands earlier today.  These symptoms are resolved.  NIHSS = 1.  Pt couldn't remember date.  Has experienced falls at home.  Family now w/her 24/7.  Uses walker and is up to [facility].  CT showed no acute abnormality.  Passed swallow test - had good appetite.  In speaking w/pt, believe she doesn't want any intervention if she coded - have asked son to bring in living will and health care poa.  There was some confusion as to her wishes in speaking w/him.   Son wasn't pleased that pt couldn't take home meds.

## 2015-09-19 NOTE — Progress Notes (Signed)
*  PRELIMINARY RESULTS* Echocardiogram 2D Echocardiogram has been performed.  Garrel Ridgel Stills 09/19/2015, 7:14 PM

## 2015-09-19 NOTE — ED Notes (Signed)
Patient transported to CT 

## 2015-09-20 ENCOUNTER — Observation Stay: Payer: Medicare Other

## 2015-09-20 DIAGNOSIS — G459 Transient cerebral ischemic attack, unspecified: Secondary | ICD-10-CM | POA: Diagnosis not present

## 2015-09-20 LAB — HEMOGLOBIN A1C: Hgb A1c MFr Bld: 5.4 % (ref 4.0–6.0)

## 2015-09-20 MED ORDER — ATORVASTATIN CALCIUM 20 MG PO TABS
20.0000 mg | ORAL_TABLET | Freq: Every day | ORAL | Status: AC
Start: 2015-09-20 — End: ?

## 2015-09-20 MED ORDER — ATORVASTATIN CALCIUM 20 MG PO TABS: 20.0000 mg | ORAL_TABLET | Freq: Every day | ORAL | Status: DC

## 2015-09-20 MED ORDER — AMLODIPINE BESYLATE 5 MG PO TABS: 5.0000 mg | ORAL_TABLET | Freq: Once | ORAL | Status: DC

## 2015-09-20 MED ORDER — AMLODIPINE BESYLATE 5 MG PO TABS
2.5000 mg | ORAL_TABLET | Freq: Once | ORAL | Status: AC
  Administered 2015-09-20: 2.5 mg via ORAL
  Filled 2015-09-20: qty 1

## 2015-09-20 NOTE — Discharge Summary (Signed)
[facility] [place] at [facility]   PATIENT NAME: Krista Weber    MR#:  [id number]  DATE OF BIRTH:  Jan 15, 1918  DATE OF ADMISSION:  09/19/2015 ADMITTING PHYSICIAN: Arnaldo Natal, MD  DATE OF DISCHARGE: 09/20/15  PRIMARY CARE PHYSICIAN: No primary care provider on file.    ADMISSION DIAGNOSIS:  TIA (transient ischemic attack) [G45.9] Transient cerebral ischemia, unspecified transient cerebral ischemia type [G45.9]  DISCHARGE DIAGNOSIS:  Active Problems:   TIA (transient ischemic attack)   SECONDARY DIAGNOSIS:   Past Medical History  Diagnosis Date  . Hypertension     HOSPITAL COURSE:   81 year old elderly Caucasian female with past medical history significant for hypertension, GERD, rheumatoid arthritis presents from assisted living facility secondary to transient episode of slurred speech and tingling of her left side.  #1 TIA-symptoms are completely resolved. Patient is back to baseline. -MRI of the brain with no acute infarcts. Chronic small vessel ischemic changes noted. -Echocardiogram with no cardiac source of emboli - Carotid Dopplers with mild to moderate stenosis of right and left internal carotid arteries noted. -Patient already on aspirin at home. That will be continued. -Added to low-dose statin. -No other neurological deficits noted. Advised to follow up with PCP.  #2 hypertension-elevated in the hospital. However patient did not get her medications according to her specific timings at home. -Will continue her clonidine and propranolol at discharge. Outpatient follow-up for any further medication management.  Motor 3 GERD-on PPI.  Discussed with family about the results on all the tests, agreeable with discharge. Will discharge back to assisted living facility.  DISCHARGE CONDITIONS:   Guarded  CONSULTS OBTAINED:   None  DRUG ALLERGIES:   Allergies  Allergen Reactions  . Macrodantin [Nitrofurantoin  Macrocrystal]     DISCHARGE MEDICATIONS:   Current Discharge Medication List    START taking these medications   Details  atorvastatin (LIPITOR) 20 MG tablet Take 1 tablet (20 mg total) by mouth daily at 6 PM. Qty: 30 tablet, Refills: 2      CONTINUE these medications which have NOT CHANGED   Details  acetaminophen (TYLENOL) 500 MG tablet Take 1 tablet (500 mg total) by mouth every 6 (six) hours. Qty: 30 tablet, Refills: 0    ALPRAZolam (XANAX) 0.25 MG tablet Take 0.25 mg by mouth 3 (three) times daily as needed for anxiety.    aspirin EC 81 MG tablet Take 1 tablet by mouth daily.    cloNIDine (CATAPRES) 0.1 MG tablet Take 0.1 mg by mouth daily.    fluticasone (FLONASE) 50 MCG/ACT nasal spray Place 2 sprays into both nostrils daily.    imipramine (TOFRANIL) 25 MG tablet Take 25 mg by mouth at bedtime.    omeprazole (PRILOSEC) 20 MG capsule Take 40 mg by mouth daily.    oxybutynin (DITROPAN-XL) 5 MG 24 hr tablet Take 5 mg by mouth daily.    propranolol (INDERAL) 40 MG tablet Take 40 mg by mouth 2 (two) times daily.         DISCHARGE INSTRUCTIONS:   1. PCP f/u in 1 week  If you experience worsening of your admission symptoms, develop shortness of breath, life threatening emergency, suicidal or homicidal thoughts you must seek medical attention immediately by calling 911 or calling your MD immediately  if symptoms less severe.  You Must read complete instructions/literature along with all the possible adverse reactions/side effects for all the Medicines you take and that have been prescribed to you. Take any new Medicines  after you have completely understood and accept all the possible adverse reactions/side effects.   Please note  You were cared for by a hospitalist during your hospital stay. If you have any questions about your discharge medications or the care you received while you were in the hospital after you are discharged, you can call the unit and asked to speak  with the hospitalist on call if the hospitalist that took care of you is not available. Once you are discharged, your primary care physician will handle any further medical issues. Please note that NO REFILLS for any discharge medications will be authorized once you are discharged, as it is imperative that you return to your primary care physician (or establish a relationship with a primary care physician if you do not have one) for your aftercare needs so that they can reassess your need for medications and monitor your lab values.    Today   CHIEF COMPLAINT:   Chief Complaint  Patient presents with  . Numbness  . Aphasia    VITAL SIGNS:  Blood pressure 167/80, pulse 71, temperature 98.6 F (37 C), temperature source Oral, resp. rate 18, height 5\' 4"  (1.626 m), weight 55.339 kg (122 lb), SpO2 97 %.  I/O:   Intake/Output Summary (Last 24 hours) at 09/20/15 1455 Last data filed at 09/20/15 1300  Gross per 24 hour  Intake   1920 ml  Output    500 ml  Net   1420 ml    PHYSICAL EXAMINATION:   Physical Exam  GENERAL:  81 y.o.-year-old patient lying in the bed with no acute distress.  EYES: Pupils equal, round, reactive to light and accommodation. No scleral icterus. Extraocular muscles intact.  HEENT: Head atraumatic, normocephalic. Oropharynx and nasopharynx clear.  NECK:  Supple, no jugular venous distention. No thyroid enlargement, no tenderness.  LUNGS: Normal breath sounds bilaterally, no wheezing, rales,rhonchi or crepitation. No use of accessory muscles of respiration.  CARDIOVASCULAR: S1, S2 normal. No rubs, or gallops.  3/6 systolic murmur present ABDOMEN: Soft, non-tender, non-distended. Bowel sounds present. No organomegaly or mass.  EXTREMITIES: No pedal edema, cyanosis, or clubbing.  NEUROLOGIC: Cranial nerves II through XII are intact. Muscle strength 5/5 in all extremities. Sensation intact. Gait not checked.  PSYCHIATRIC: The patient is alert and oriented x 3.   SKIN: No obvious rash, lesion, or ulcer.   DATA REVIEW:   CBC  Recent Labs Lab 09/19/15 1113  WBC 8.4  HGB 13.1  HCT 39.0  PLT 234    Chemistries   Recent Labs Lab 09/19/15 1113  NA 127*  K 4.4  CL 94*  CO2 27  GLUCOSE 106*  BUN 13  CREATININE 0.57  CALCIUM 9.1  AST 18  ALT 15  ALKPHOS 102  BILITOT 0.8    Cardiac Enzymes  Recent Labs Lab 09/19/15 1113  TROPONINI <0.03    Microbiology Results  No results found for this or any previous visit.  RADIOLOGY:  Ct Head Wo Contrast  09/19/2015  CLINICAL DATA:  Numbness in both hands which began this morning. The patient subsequently had an episode of difficulty speaking which lasted 5-7 minutes. Initial encounter. EXAM: CT HEAD WITHOUT CONTRAST TECHNIQUE: Contiguous axial images were obtained from the base of the skull through the vertex without intravenous contrast. COMPARISON:  Head CT scan 06/08/2015 and 06/03/2012. FINDINGS: There is cortical atrophy and chronic microvascular ischemic change. No evidence of acute intracranial abnormality including hemorrhage, infarct, mass lesion, mass effect, midline shift or abnormal extra-axial  fluid collection is identified. No hydrocephalus or pneumocephalus. The calvarium is intact. IMPRESSION: No acute abnormality. Atrophy and chronic microvascular ischemic change. Electronically Signed   By: Drusilla Kanner M.D.   On: 09/19/2015 11:42   Mr Brain Wo Contrast  09/20/2015  CLINICAL DATA:  Slurred speech. Left-sided numbness. Sudden onset of memory loss. This occurred 1 day ago, and resolved after 5 minutes. Stroke risk factors include hypertension. EXAM: MRI HEAD WITHOUT CONTRAST MRA HEAD WITHOUT CONTRAST TECHNIQUE: Multiplanar, multiecho pulse sequences of the brain and surrounding structures were obtained without intravenous contrast. Angiographic images of the head were obtained using MRA technique without contrast. COMPARISON:  CT head 09/19/2015.  MR head 04/08/2009.  FINDINGS: MRI HEAD FINDINGS No evidence for acute infarction, hemorrhage, mass lesion, hydrocephalus, or extra-axial fluid. Generalized atrophy. Mild to moderate T2 and FLAIR hyperintensities throughout the white matter, likely small vessel disease. Flow voids are maintained throughout the carotid, basilar, and vertebral arteries. There are no areas of chronic hemorrhage. Pituitary, pineal, and cerebellar tonsils unremarkable. Mild cervical spondylosis, with degenerative anterolisthesis C4-5. Mild pannus surrounds the odontoid, noncompressive. Visualized calvarium, skull base, and upper cervical osseous structures unremarkable. Scalp and extracranial soft tissues, orbits, sinuses, and mastoids show no acute process. BILATERAL cataract extraction. Trace mastoid effusions. MRA HEAD FINDINGS The internal carotid arteries are widely patent. The basilar artery is widely patent with both vertebrals contributing, RIGHT dominant. No intracranial stenosis or aneurysm. Normal variant origin of the anterior temporal branch LEFT MCA on the LEFT from the ICA terminus. IMPRESSION: No acute intracranial findings. Specifically no acute stroke or hemorrhage. Atrophy and small vessel disease, progressive since 2010. No extracranial or intracranial flow reducing lesion of significance. Electronically Signed   By: Elsie Stain M.D.   On: 09/20/2015 14:34   US Carotid Bilateral  09/19/2015  CLINICAL DATA:  81 year old female with symptoms of transient ischemic attack and speech disturbance earlier today EXAM: BILATERAL CAROTID DUPLEX ULTRASOUND TECHNIQUE: Wallace Cullens scale imaging, color Doppler and duplex ultrasound were performed of bilateral carotid and vertebral arteries in the neck. COMPARISON:  Head CT 09/19/2015 FINDINGS: Criteria: Quantification of carotid stenosis is based on velocity parameters that correlate the residual internal carotid diameter with NASCET-based stenosis levels, using the diameter of the distal internal  carotid lumen as the denominator for stenosis measurement. The following velocity measurements were obtained: RIGHT ICA:  138/23 cm/sec CCA:  71/9 cm/sec SYSTOLIC ICA/CCA RATIO:  2 DIASTOLIC ICA/CCA RATIO:  2.6 ECA:  55 cm/sec LEFT ICA:  91/17 cm/sec CCA:  71/8 cm/sec SYSTOLIC ICA/CCA RATIO:  1.3 DIASTOLIC ICA/CCA RATIO:  2 ECA:  77 cm/sec RIGHT CAROTID ARTERY: Trace heterogeneous atherosclerotic plaque in the proximal internal carotid artery. In the region of plaque the estimated stenosis falls into the less than 50% diameter range. There is more significant elevation of the peak systolic velocity distally which is likely spurious and related to vessel tortuosity. RIGHT VERTEBRAL ARTERY:  Patent with normal antegrade flow. LEFT CAROTID ARTERY: Mild focal heterogeneous atherosclerotic plaque in the internal carotid bulb. By peak systolic velocity criteria the estimated stenosis remains less than 50%. The mid and distal internal carotid artery are tortuous. LEFT VERTEBRAL ARTERY:  Patent with normal antegrade flow. IMPRESSION: 1. Mild (1-49%) stenosis proximal right internal carotid artery secondary to scant heterogeneous atherosclerotic plaque. Probable spurious elevation of the peak systolic velocity in the distal internal carotid artery. 2. Mild (1-49%) stenosis proximal left internal carotid artery secondary to heterogeneous atherosclerotic plaque. 3. Vertebral arteries are patent with normal  antegrade flow. Signed, Sterling Big, MD [facility] Electronically Signed   By: Malachy Moan M.D.   On: 09/19/2015 17:01   Mr Maxine Glenn Head/brain Wo Cm  09/20/2015  CLINICAL DATA:  Slurred speech. Left-sided numbness. Sudden onset of memory loss. This occurred 1 day ago, and resolved after 5 minutes. Stroke risk factors include hypertension. EXAM: MRI HEAD WITHOUT CONTRAST MRA HEAD WITHOUT CONTRAST TECHNIQUE: Multiplanar, multiecho pulse sequences of the  brain and surrounding structures were obtained without intravenous contrast. Angiographic images of the head were obtained using MRA technique without contrast. COMPARISON:  CT head 09/19/2015.  MR head 04/08/2009. FINDINGS: MRI HEAD FINDINGS No evidence for acute infarction, hemorrhage, mass lesion, hydrocephalus, or extra-axial fluid. Generalized atrophy. Mild to moderate T2 and FLAIR hyperintensities throughout the white matter, likely small vessel disease. Flow voids are maintained throughout the carotid, basilar, and vertebral arteries. There are no areas of chronic hemorrhage. Pituitary, pineal, and cerebellar tonsils unremarkable. Mild cervical spondylosis, with degenerative anterolisthesis C4-5. Mild pannus surrounds the odontoid, noncompressive. Visualized calvarium, skull base, and upper cervical osseous structures unremarkable. Scalp and extracranial soft tissues, orbits, sinuses, and mastoids show no acute process. BILATERAL cataract extraction. Trace mastoid effusions. MRA HEAD FINDINGS The internal carotid arteries are widely patent. The basilar artery is widely patent with both vertebrals contributing, RIGHT dominant. No intracranial stenosis or aneurysm. Normal variant origin of the anterior temporal branch LEFT MCA on the LEFT from the ICA terminus. IMPRESSION: No acute intracranial findings. Specifically no acute stroke or hemorrhage. Atrophy and small vessel disease, progressive since 2010. No extracranial or intracranial flow reducing lesion of significance. Electronically Signed   By: Elsie Stain M.D.   On: 09/20/2015 14:34    EKG:   Orders placed or performed during the hospital encounter of 09/19/15  . ED EKG  . ED EKG  . EKG 12-Lead  . EKG 12-Lead  . EKG 12-Lead  . EKG 12-Lead      Management plans discussed with the patient, family and they are in agreement.  CODE STATUS:     Code Status Orders        Start     Ordered   09/19/15 1531  Full code   Continuous      09/19/15 1530    Advance Directive Documentation        Most Recent Value   Type of Advance Directive  Healthcare Power of Attorney, Living will   Pre-existing out of facility DNR order (yellow form or pink MOST form)     "MOST" Form in Place?        TOTAL TIME TAKING CARE OF THIS PATIENT: 37 minutes.    Enid Baas M.D on 09/20/2015 at 2:55 PM  Between 7am to 6pm - Pager - [phone]  After 6pm go to [email or web address] - password [email or web address] [facility]  [facility]  Office  [phone]  CC: Primary care physician; No primary care provider on file.

## 2015-09-20 NOTE — Plan of Care (Signed)
Problem: Tissue Perfusion: Goal: Cerebral tissue perfusion will improve Outcome: Progressing Pt alert and oriented. No new neuro deficits noted during this shift. Neuro checks Q 4 hrs now. NIH 0, MRI for today. no c/o pain nor distress noted. Up to the [facility] with one assist. Daughter-in-law at bedside. Continue to monitor.

## 2015-09-20 NOTE — Progress Notes (Addendum)
OT Cancellation Note  Patient Details Name: Krista Weber MRN: [id number] DOB: 1918-01-04   Cancelled Treatment:    Reason Eval/Treat Not Completed: Patient declined, no reason specified.  Patient and son feel Occupational Therapy is not needed as symptoms have resolved.  Will be available if needed.  Ocie Cornfield 09/20/2015, 9:37 AM

## 2015-09-20 NOTE — Progress Notes (Signed)
PT Cancellation Note  Patient Details Name: Krista Weber MRN: [id number] DOB: 1918-03-10   Cancelled Treatment:    Reason Eval/Treat Not Completed: PT screened, no needs identified, will sign off. All symptoms have resolved; all imaging is negative. Pt/family are not agreeable to PT evaluation at this time. I took 5 minutes to address confusion regarding all therapy consults and explain why PT/OT/SLP are called in to address all TIA/CVA patients. Signing off; no services warranted.   Buccola,Allan C 09/20/2015, 3:30 PM 3:31 PM  Rosamaria Lints, PT, DPT [place] License # [id number]

## 2015-09-21 NOTE — Progress Notes (Signed)
Discussed discharge instructions and medications with pt, her husband , and her daughter at bedside.  All questions answered.  Made aware to follow up with PCP in 1 week and new medication sent to [facility].  IV removed. Pt transported home via care by her husband and daughter.  Orson Ape, RN

## 2017-11-01 IMAGING — MR MR MRA HEAD W/O CM
1 series · 25 of 48 positions shown · non-contrast
Comparison: CT head 09/19/2015.  MR head 04/08/2009.

CLINICAL DATA: Slurred speech. Left-sided numbness. Sudden onset of
memory loss. This occurred 1 day ago, and resolved after 5 minutes.
Stroke risk factors include hypertension.

EXAM:
MRI HEAD WITHOUT CONTRAST
MRA HEAD WITHOUT CONTRAST
TECHNIQUE: Multiplanar, multiecho pulse sequences of the brain and surrounding
structures were obtained without intravenous contrast. Angiographic
images of the head were obtained using MRA technique without
contrast.

[Series 3: TOF · axial · 0.8mm · 0.39mm/px · z∈[-35,+58]mm · 25 of 128 slices shown]
[im 1/128]
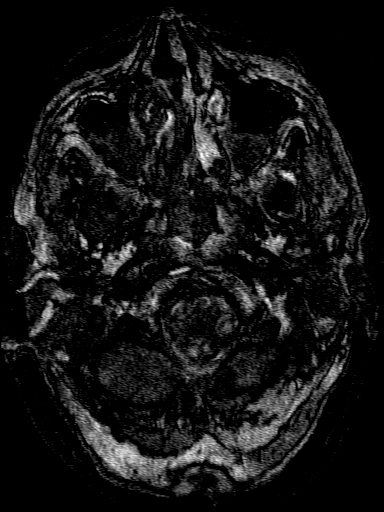
[im 3/128]
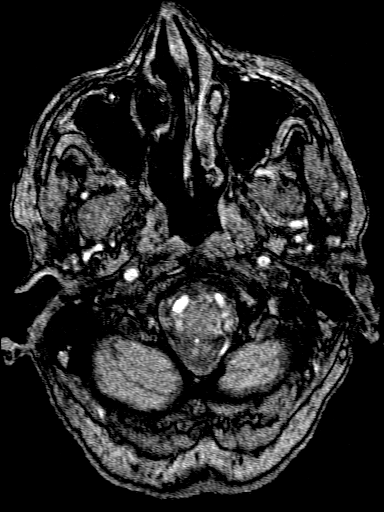
[im 6/128]
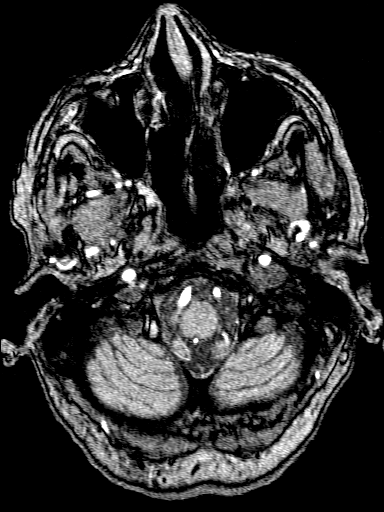
[im 9/128]
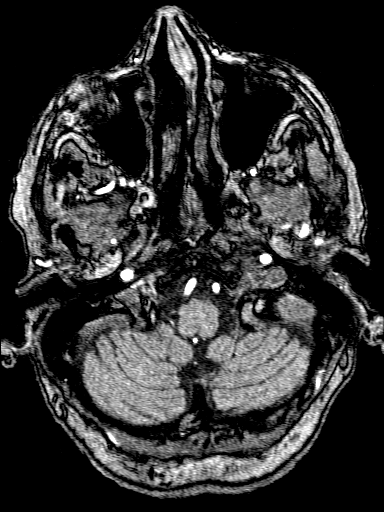
[im 11/128]
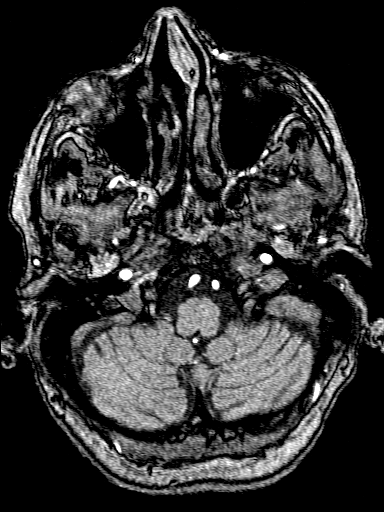
[im 14/128]
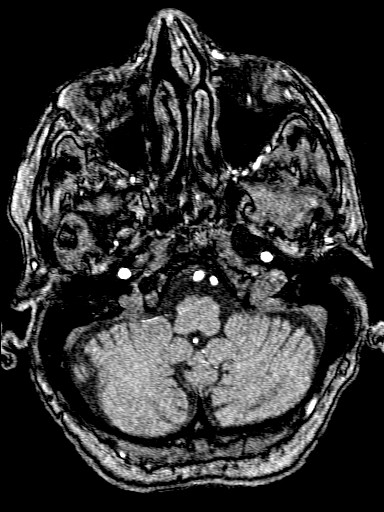
[im 17/128]
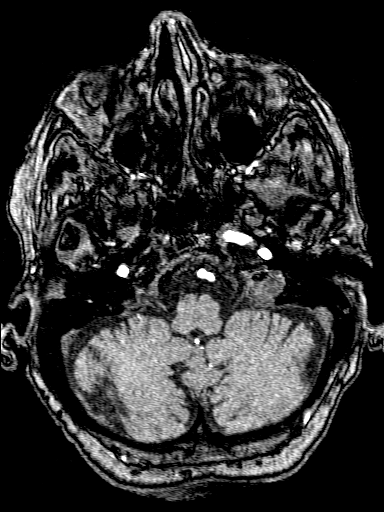
[im 19/128]
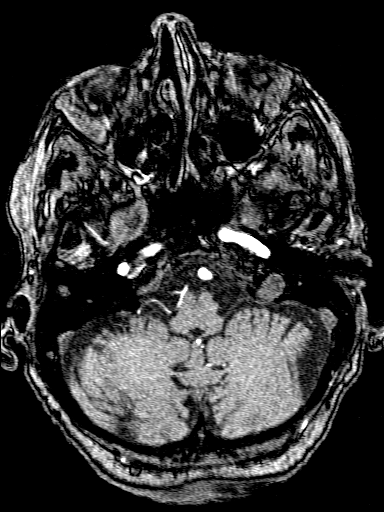
[im 22/128]
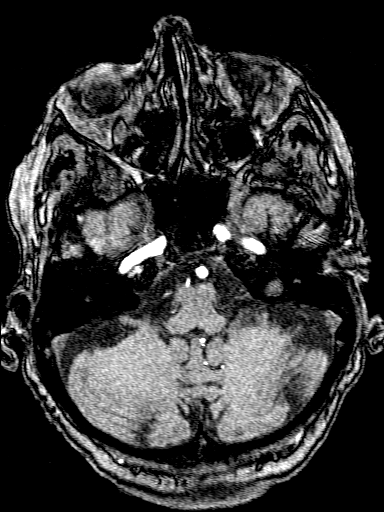
[im 25/128]
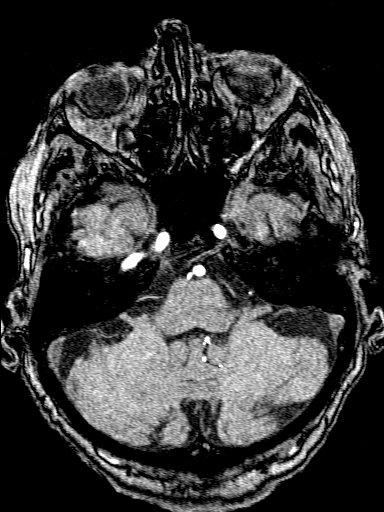
[im 28/128]
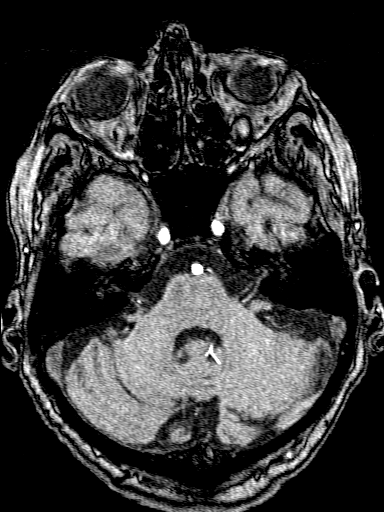
[im 30/128]
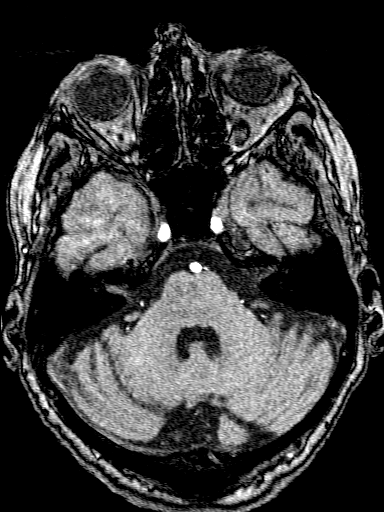
[im 33/128]
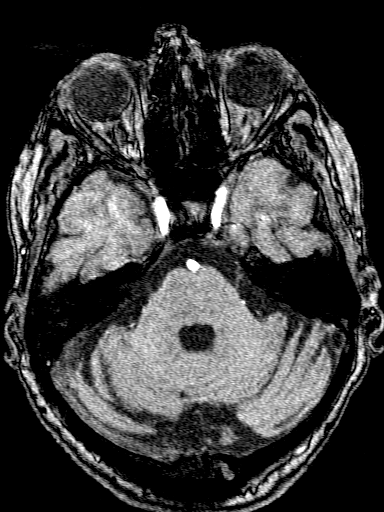
[im 36/128]
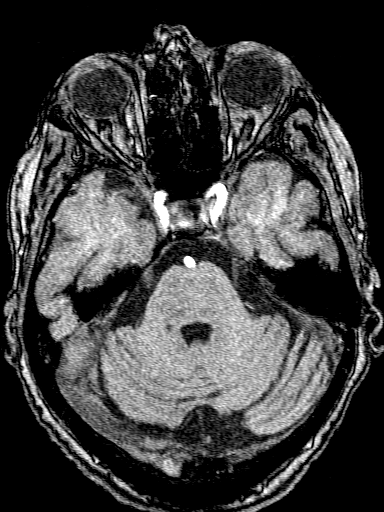
[im 38/128]
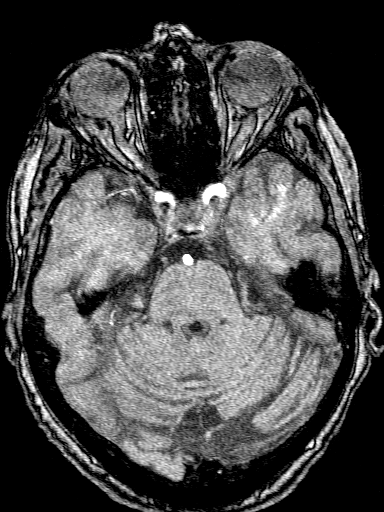
[im 41/128]
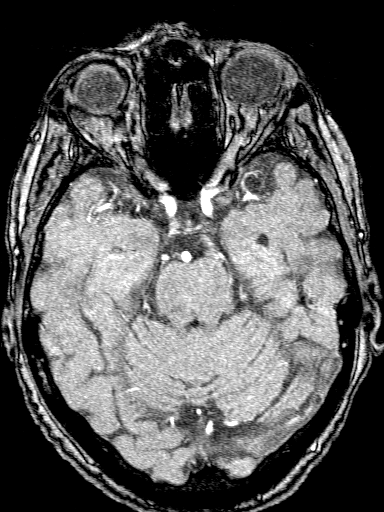
[im 44/128]
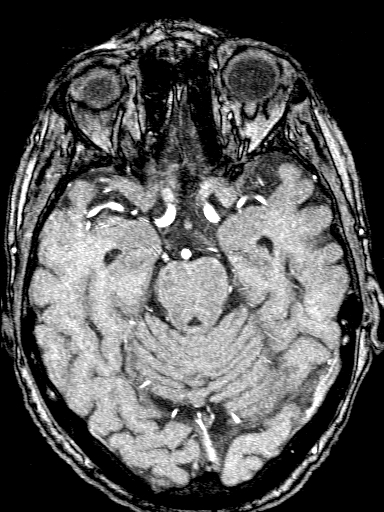
[im 46/128]
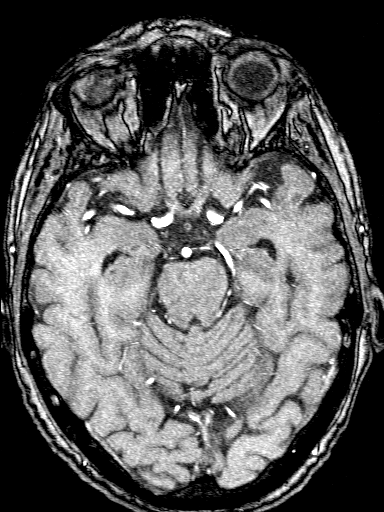
[im 57/128]
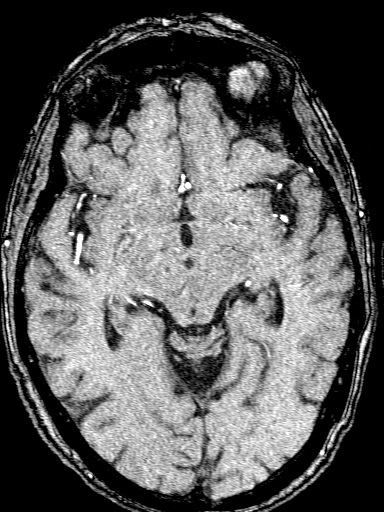
[im 65/128]
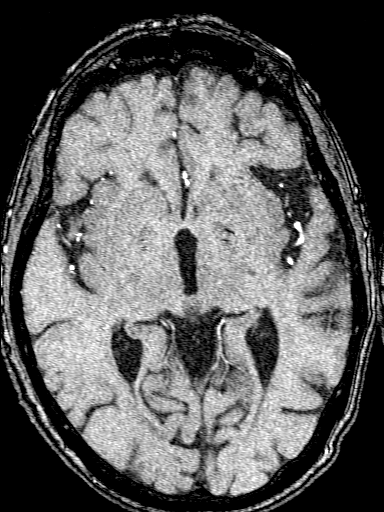
[im 73/128]
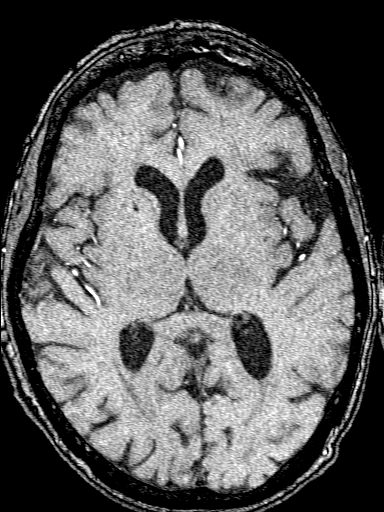
[im 90/128]
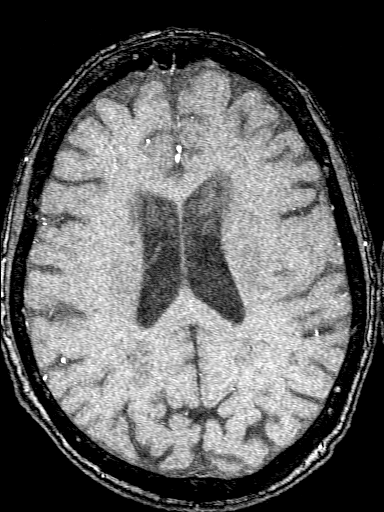
[im 106/128]
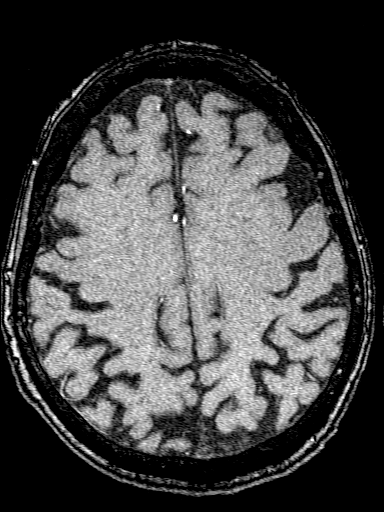
[im 109/128]
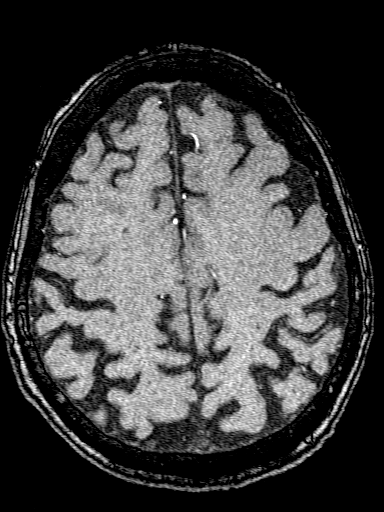
[im 122/128]
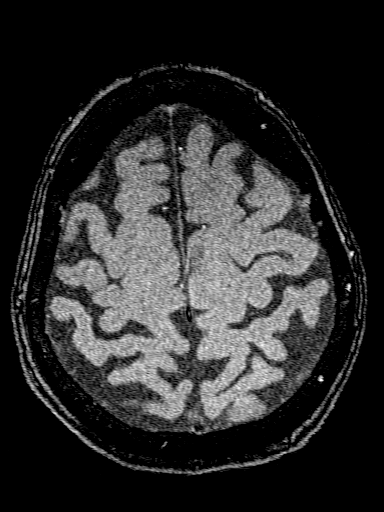

[25 of 48 positions shown; findings below may reference images not displayed]

FINDINGS: MRI HEAD FINDINGS

No evidence for acute infarction, hemorrhage, mass lesion,
hydrocephalus, or extra-axial fluid. Generalized atrophy. Mild to
moderate T2 and FLAIR hyperintensities throughout the white matter,
likely small vessel disease.

Flow voids are maintained throughout the carotid, basilar, and
vertebral arteries. There are no areas of chronic hemorrhage.

Pituitary, pineal, and cerebellar tonsils unremarkable. Mild
cervical spondylosis, with degenerative anterolisthesis C4-5. Mild
pannus surrounds the odontoid, noncompressive.

Visualized calvarium, skull base, and upper cervical osseous
structures unremarkable. Scalp and extracranial soft tissues,
orbits, sinuses, and mastoids show no acute process. BILATERAL
cataract extraction. Trace mastoid effusions.

MRA HEAD FINDINGS

The internal carotid arteries are widely patent. The basilar artery
is widely patent with both vertebrals contributing, RIGHT dominant.
No intracranial stenosis or aneurysm. Normal variant origin of the
anterior temporal branch LEFT MCA on the LEFT from the ICA terminus.
IMPRESSION: No acute intracranial findings. Specifically no acute stroke or
hemorrhage.

Atrophy and small vessel disease, progressive since 5949.

No extracranial or intracranial flow reducing lesion of
significance.

## 2019-10-26 DEATH — deceased
